# Patient Record
Sex: Male | Born: 1980 | Race: White | Hispanic: No | Marital: Married | Smoking: Never smoker
Health system: Southern US, Community
[De-identification: ages and names within clinical notes are randomized; demographics above are authoritative.]

## PROBLEM LIST (undated history)

## (undated) DIAGNOSIS — I493 Ventricular premature depolarization: Secondary | ICD-10-CM

## (undated) HISTORY — PX: HERNIA REPAIR: SHX51

## (undated) HISTORY — PX: APPENDECTOMY: SHX54

## (undated) HISTORY — PX: OTHER SURGICAL HISTORY: SHX169

## (undated) HISTORY — DX: Ventricular premature depolarization: I49.3

---

## 2009-07-09 DIAGNOSIS — I493 Ventricular premature depolarization: Secondary | ICD-10-CM

## 2009-07-09 HISTORY — DX: Ventricular premature depolarization: I49.3

## 2013-12-04 ENCOUNTER — Encounter (INDEPENDENT_AMBULATORY_CARE_PROVIDER_SITE_OTHER): Payer: Self-pay

## 2013-12-04 ENCOUNTER — Ambulatory Visit (HOSPITAL_BASED_OUTPATIENT_CLINIC_OR_DEPARTMENT_OTHER)
Admission: RE | Admit: 2013-12-04 | Discharge: 2013-12-04 | Disposition: A | Discharge status: Home / Self Care | Payer: BC Managed Care – PPO | Source: Ambulatory Visit | Attending: Family Medicine | Admitting: Family Medicine

## 2013-12-04 ENCOUNTER — Encounter: Payer: Self-pay | Admitting: Family Medicine

## 2013-12-04 ENCOUNTER — Ambulatory Visit (INDEPENDENT_AMBULATORY_CARE_PROVIDER_SITE_OTHER): Payer: BC Managed Care – PPO | Admitting: Family Medicine

## 2013-12-04 VITALS — BP 122/71 | HR 66 | Temp 97.5°F | Ht 72.0 in | Wt 192.8 lb

## 2013-12-04 DIAGNOSIS — M25569 Pain in unspecified knee: Secondary | ICD-10-CM | POA: Insufficient documentation

## 2013-12-04 DIAGNOSIS — M25562 Pain in left knee: Secondary | ICD-10-CM

## 2013-12-04 NOTE — Patient Instructions (Signed)
Get x-rays before you leave today - we will call you with the results (I expect these to be normal). Overall your exam is very reassuring - no evidence meniscus tear, ligament tear, etc based on history, location of pain, exam. 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 Avoid painful activities (especially squats and lunges, plyometrics, increasing running mileage) while this is painful. Straight leg raise, hip side raises, straight leg raises with foot turned outwards 3 sets of 10 once a day for next 6 weeks. Add ankle weight if these become too easy. Correct overpronation with something like Dr. Jari Sportsman active series insoles. Consider shoe evaluation or new shoes if you continue to have problems (like at [employer]). Icing 15 minutes at a time 3-4 times a day if needed. Tylenol or aleve as needed for pain I wouldn't put any restrictions on you necessarily though consider dropping to about 50% of what you would normally do and increase from there by about 10% per week. Follow up with me as needed.

## 2013-12-08 ENCOUNTER — Encounter: Payer: Self-pay | Admitting: Family Medicine

## 2013-12-08 DIAGNOSIS — M25562 Pain in left knee: Secondary | ICD-10-CM | POA: Insufficient documentation

## 2013-12-08 NOTE — Assessment & Plan Note (Signed)
radiographs negative for cyst, other pathology.  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  Exam otherwise benign.  Home exercise program reviewed.  Arch supports/inserts.  Icing, tylenol/nsaids as needed.  Slow progression of activities.  F/u prn.

## 2013-12-08 NOTE — Progress Notes (Signed)
Patient ID: Mike Warren, male   DOB: 1981-02-28, 33 y.o.   MRN: [id number]  PCP: No primary provider on file.  Subjective:   HPI: Patient is a 33 y.o. male here for left knee pain.  Patient denies known injury. He states he was able to run a half marathon about 3 months ago. He did a short run a few days after and the next day had medial knee pain. Seemed worse with sitting then going to get up. No catching, locking, giving out. No swelling, bruising. Has tried HEP (wife is a physical therapist), icing, nsaids. Took a month off from running. Has improved since then but hasn't really tested the knee.  History reviewed. No pertinent past medical history.  No current outpatient prescriptions on file prior to visit.   No current facility-administered medications on file prior to visit.    Past Surgical History  Procedure Laterality Date  . Hernia repair    . Appendectomy      Allergies  Allergen Reactions  . Penicillins Rash    History   Social History  . Marital Status: Married    Spouse Name: N/A    Number of Children: N/A  . Years of Education: N/A   Occupational History  . Not on file.   Social History Main Topics  . Smoking status: Never Smoker   . Smokeless tobacco: Not on file  . Alcohol Use: Not on file  . Drug Use: Not on file  . Sexual Activity: Not on file   Other Topics Concern  . Not on file   Social History Narrative  . No narrative on file    History reviewed. No pertinent family history.  BP 122/71  Pulse 66  Temp(Src) 97.5 F (36.4 C) (Oral)  Ht 6' (1.829 m)  Wt 192 lb 12.8 oz (87.454 kg)  BMI 26.14 kg/m2  SpO2 98%  Review of Systems: See HPI above.    Objective:  Physical Exam:  Gen: NAD  Left knee: No gross deformity, ecchymoses, swelling. No TTP currently (points to above medial joint line, medial kneecap area as location of pain). FROM. Negative ant/post drawers. Negative valgus/varus testing. Negative  lachmanns. Negative mcmurrays, apleys, patellar apprehension, clarkes. Hip abduction 5/5 strength. NV intact distally. Mild overpronation    Assessment & Plan:  1. Left knee pain - radiographs negative for cyst, other pathology.  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  Exam otherwise benign.  Home exercise program reviewed.  Arch supports/inserts.  Icing, tylenol/nsaids as needed.  Slow progression of activities.  F/u prn.

## 2015-04-10 IMAGING — CR DG KNEE AP/LAT W/ SUNRISE*L*
2 series · 2 of 2 positions shown · non-contrast
Comparison: None.

CLINICAL DATA: Medial knee pain after running a distance on
09/20/2013

EXAM:
DG KNEE - 3 VIEWS

[t knee ap left]
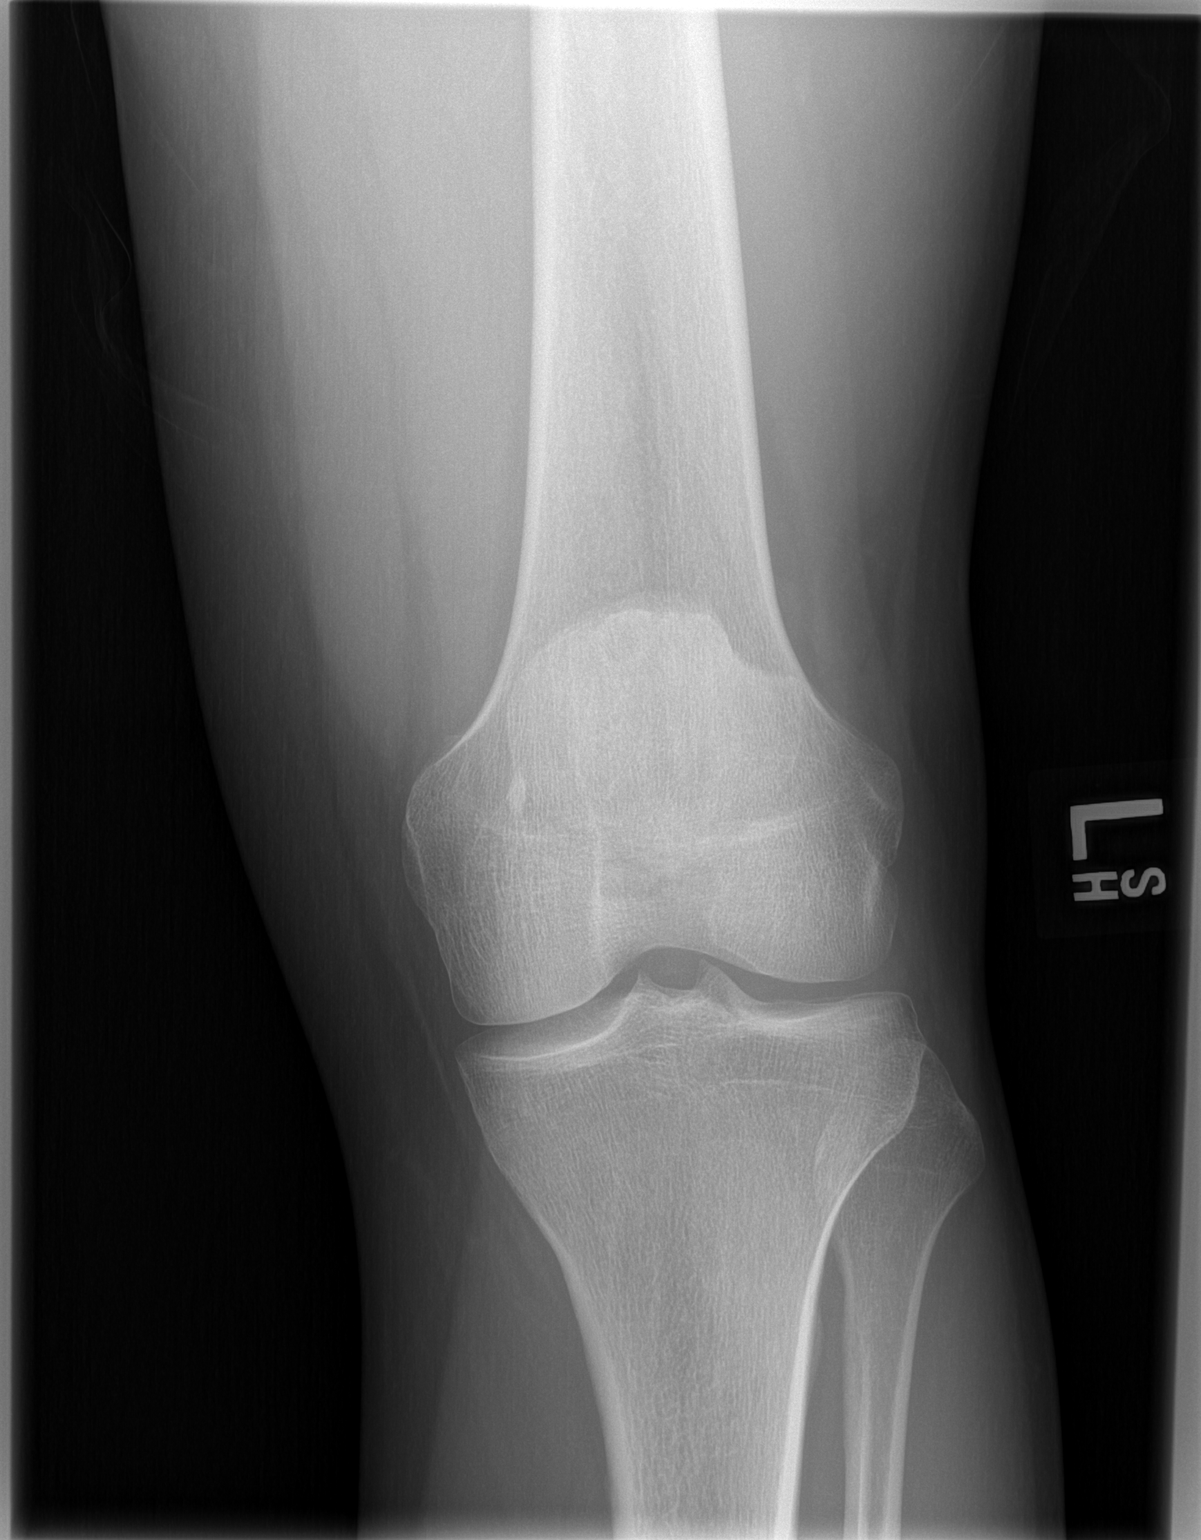

[t knee lat left]
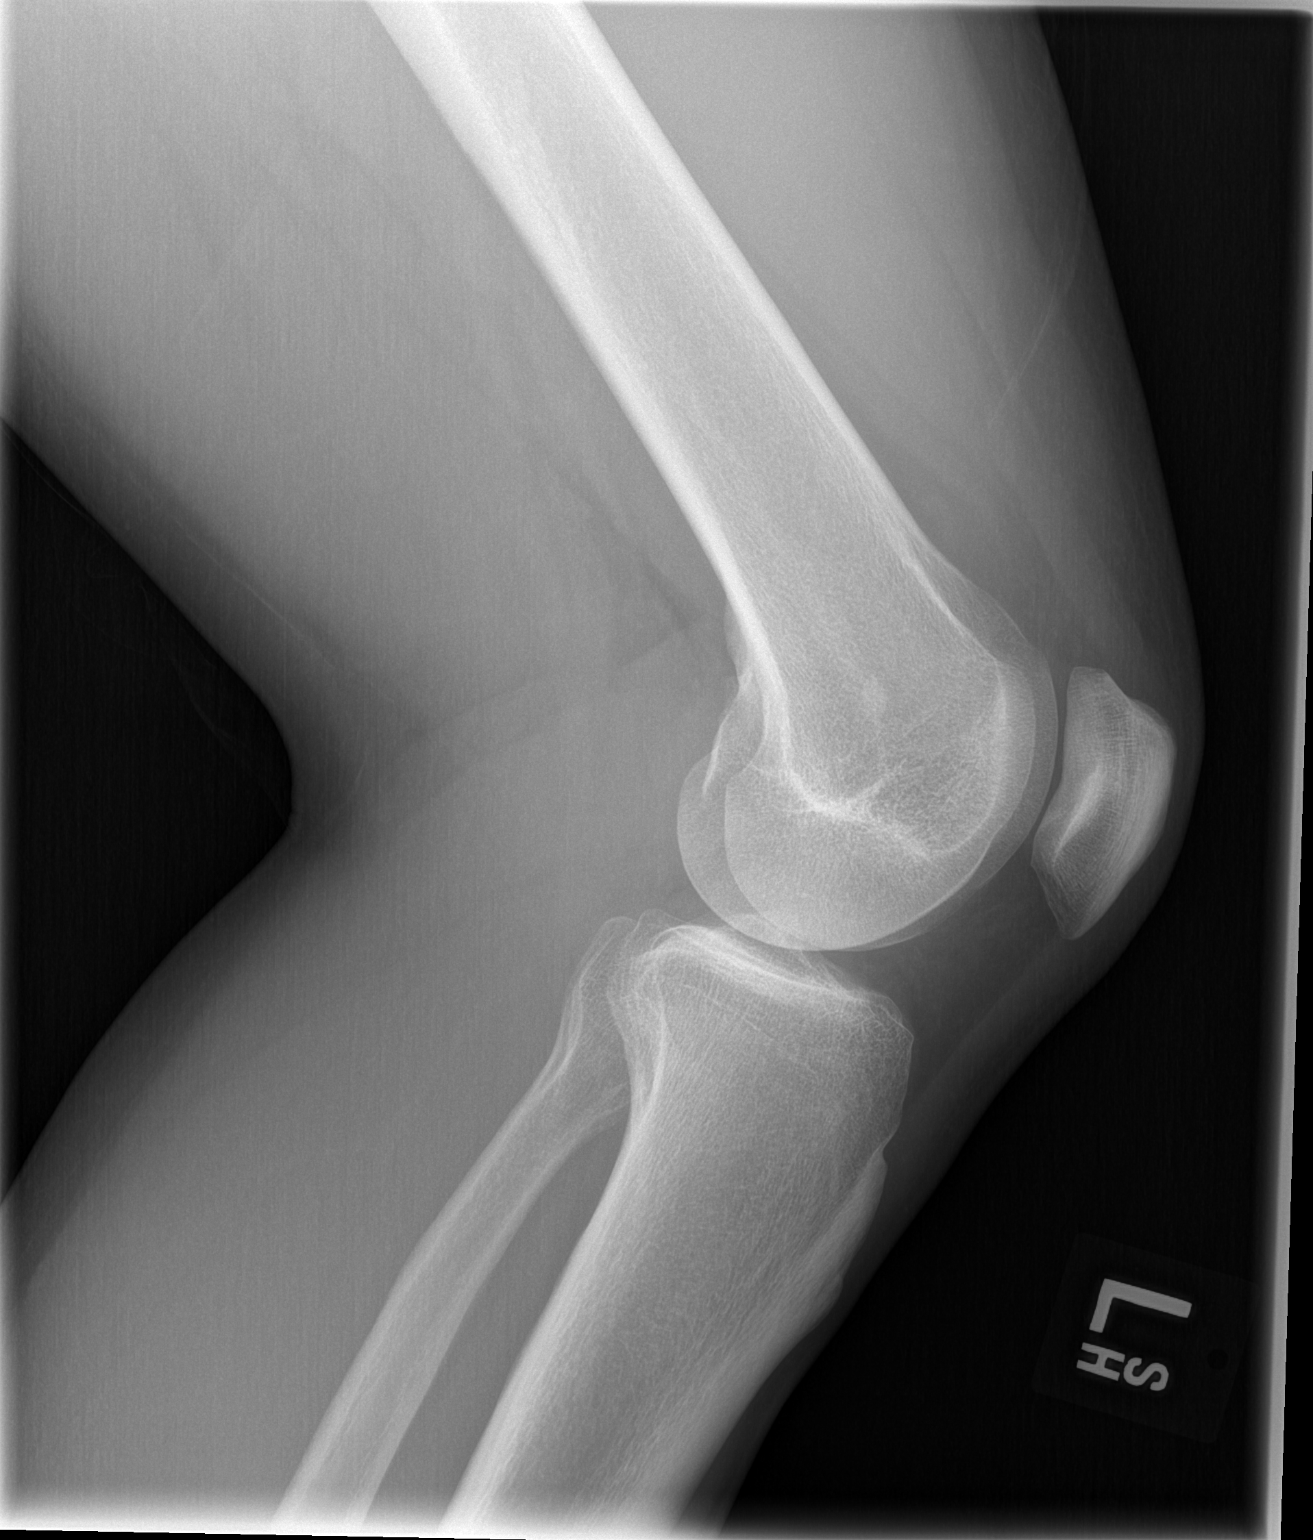

[2 of 2 positions shown; findings below may reference images not displayed]

FINDINGS: There is no evidence of fracture, dislocation, or joint effusion.
There is no evidence of arthropathy or other focal bone abnormality.
Soft tissues are unremarkable.
IMPRESSION: Negative.

## 2015-11-25 DIAGNOSIS — Z0000 Encounter for general adult medical examination without abnormal findings: Secondary | ICD-10-CM | POA: Diagnosis not present

## 2015-12-14 DIAGNOSIS — Z0000 Encounter for general adult medical examination without abnormal findings: Secondary | ICD-10-CM | POA: Diagnosis not present

## 2015-12-14 DIAGNOSIS — Z1389 Encounter for screening for other disorder: Secondary | ICD-10-CM | POA: Diagnosis not present

## 2015-12-14 DIAGNOSIS — Z6826 Body mass index (BMI) 26.0-26.9, adult: Secondary | ICD-10-CM | POA: Diagnosis not present

## 2016-02-24 DIAGNOSIS — R109 Unspecified abdominal pain: Secondary | ICD-10-CM | POA: Diagnosis not present

## 2016-02-24 DIAGNOSIS — Z6826 Body mass index (BMI) 26.0-26.9, adult: Secondary | ICD-10-CM | POA: Diagnosis not present

## 2016-02-24 DIAGNOSIS — R740 Nonspecific elevation of levels of transaminase and lactic acid dehydrogenase [LDH]: Secondary | ICD-10-CM | POA: Diagnosis not present

## 2016-02-24 DIAGNOSIS — R1013 Epigastric pain: Secondary | ICD-10-CM | POA: Diagnosis not present

## 2016-02-29 DIAGNOSIS — R1013 Epigastric pain: Secondary | ICD-10-CM | POA: Diagnosis not present

## 2016-03-06 ENCOUNTER — Encounter: Payer: Self-pay | Admitting: Physician Assistant

## 2016-03-15 ENCOUNTER — Ambulatory Visit (INDEPENDENT_AMBULATORY_CARE_PROVIDER_SITE_OTHER): Payer: BLUE CROSS/BLUE SHIELD | Admitting: Physician Assistant

## 2016-03-15 ENCOUNTER — Encounter (INDEPENDENT_AMBULATORY_CARE_PROVIDER_SITE_OTHER): Payer: Self-pay

## 2016-03-15 ENCOUNTER — Encounter: Payer: Self-pay | Admitting: Physician Assistant

## 2016-03-15 VITALS — BP 110/62 | HR 72 | Ht 72.0 in | Wt 192.0 lb

## 2016-03-15 DIAGNOSIS — R1013 Epigastric pain: Secondary | ICD-10-CM | POA: Diagnosis not present

## 2016-03-15 DIAGNOSIS — K6289 Other specified diseases of anus and rectum: Secondary | ICD-10-CM

## 2016-03-15 DIAGNOSIS — R12 Heartburn: Secondary | ICD-10-CM | POA: Diagnosis not present

## 2016-03-15 DIAGNOSIS — R197 Diarrhea, unspecified: Secondary | ICD-10-CM

## 2016-03-15 MED ORDER — OMEPRAZOLE 40 MG PO CPDR: 40.0000 mg | DELAYED_RELEASE_CAPSULE | Freq: Every day | ORAL | 1 refills | Status: DC

## 2016-03-15 NOTE — Progress Notes (Signed)
Chief Complaint: Epigastric Abdominal Pain, Diarrhea  HPI:  Mike Warren is a 35 year old Caucasian male who presents to clinic today, as a referral from Dr. Link Snuffer, for a complaint of epigastric pain and recent episode of diarrhea. Patient had recent pylori testing on 02/29/16 with a negative IgG, IgA and an IgM positive at 29.8 units. CBC on 02/24/16 was within normal limits and CMP from that day showed a minimally elevated AST at 48 and ALT of 59.  Today, the patient tells me that a little over a month ago he started with "bad heartburn" as well as epigastric pain. He had not had a bowel movement in 3 days and thought this may be related, so he took a dose of MiraLAX, this resulted in fever and urgent liquid diarrhea for almost 2 weeks. The patient notes that his 46-year-old daughter also became sick with the same symptoms on the very next day. He discontinued MiraLax after that one dose, since then he has continued with mostly loose stools, which do have "some more form than earlier this month", and a constant 2-3 out of 10 epigastric aggravating pain. Initially he also describes some anorexia and a total of 15 pound weight loss, his appetite has picked up within the past week or so and he has gained 2-3 pounds back. He did see his primary care provider shortly after this all started with labs as above and was also started on Prilosec 20 mg once daily, he has been on this for around 2-1/2 weeks and has no further symptoms of heartburn, but does continue with his epigastric pain. Associated symptoms include 2 episodes of vomiting, last a week and a half ago.  Patient also describes some rectal pain today. He tells me that this occurs mostly at night while he is laying down and feels like a "shearing/shooting burning pain that radiates from his rectum into his testicle", this can last up to 45 seconds, but then disappears and does not come back. This pain is not elicited by having a bowel movement. Patient  does describe being a runner, though he denies any chronic muscle aches or pains.  Patient denies any further fever, chills, sweats, blood in his stool, melena, continued weight loss, fatigue, nausea or increase in gas or bloat.  Past Medical History:  Diagnosis Date  . PVC (premature ventricular contraction) 2011    Past Surgical History:  Procedure Laterality Date  . APPENDECTOMY    . HERNIA REPAIR      Current Outpatient Prescriptions  Medication Sig Dispense Refill  . omeprazole (PRILOSEC) 40 MG capsule Take 1 capsule (40 mg total) by mouth daily. 30 capsule 1   No current facility-administered medications for this visit.     Allergies as of 03/15/2016 - Review Complete 03/15/2016  Allergen Reaction Noted  . Penicillins Rash 12/04/2013    Family History  Problem Relation Age of Onset  . Parkinson's disease Maternal Grandmother   . Stroke Maternal Grandfather   . Heart attack Paternal Grandmother     Social History   Social History  . Marital status: Married    Spouse name: N/A  . Number of children: N/A  . Years of education: N/A   Occupational History  . Not on file.   Social History Main Topics  . Smoking status: Never Smoker  . Smokeless tobacco: Not on file  . Alcohol use Not on file  . Drug use: Unknown  . Sexual activity: Not on file   Other Topics  Concern  . Not on file   Social History Narrative  . No narrative on file    Review of Systems:    Constitutional: Positive for a weight loss of 15 pounds and fever 1 day No fatigue HEENT: Eyes: No change in vision               Ears, Nose, Throat:  No change in hearing or congestion Skin: No rash or itching Cardiovascular: No chest pain, chest pressure or palpitations     Respiratory: No SOB or cough Gastrointestinal: See HPI and otherwise negative Genitourinary: No dysuria or change in urinary frequency Neurological: No headache, dizziness or syncope Musculoskeletal: No muscle, back pain,  joint pain or stiffness.  Hematologic: No bleeding or bruising Psychiatric: No history of depression or anxiety   Physical Exam:  Vital signs: BP 110/62 (BP Location: Left Arm, Patient Position: Sitting, Cuff Size: Normal)   Pulse 72   Ht 6' (1.829 m)   Wt 192 lb (87.1 kg)   BMI 26.04 kg/m   General:   Pleasant Caucasian male appears to be in NAD, Well developed, Well nourished, alert and cooperative Head:  Normocephalic and atraumatic. Eyes:   PEERL, EOMI. No icterus. Conjunctiva pink. Ears:  Normal auditory acuity. Neck:  Supple Throat: Oral cavity and pharynx without inflammation, swelling or lesion. Teeth in good condition. Lungs: Respirations even and unlabored. Lungs clear to auscultation bilaterally.   No wheezes, crackles, or rhonchi.  Heart: Normal S1, S2. No MRG. Regular rate and rhythm. No peripheral edema, cyanosis or pallor.  Abdomen:  Soft, nondistended, mild TTP in the epigastrium No rebound or guarding. Normal bowel sounds. No appreciable masses or hepatomegaly. Rectal:  Not performed.  Msk:  Symmetrical without gross deformities. Peripheral pulses intact.  Extremities:  Without edema, no deformity or joint abnormality. Normal ROM, normal sensation. Neurologic:  Alert and  oriented x4;  grossly normal neurologically.  Skin:   Dry and intact without significant lesions or rashes. Psychiatric: Oriented to person, place and time. Demonstrates good judgement and reason without abnormal affect or behaviors.  Recent labs: See history of present illness  Assessment: 1. Epigastric pain: Started around a month ago, episode of what sounds like viral illness in between, heartburn which has stopped with Prilosec 20 mg daily for the past 2-1/2 weeks, continue 2-3 out of 10 aggravating epigastric pain, H. pylori testing IgM positive; consider gastritis versus viral process versus GERD versus H. pylori versus other 2. Heartburn: Better after 2-1/2 weeks of Prilosec 20 mg daily 3.  Diarrhea: Per patient he has always had an issue with his bowel movements, typically constipation, and over the past month since "viral episode", now with looser stools which are unpredictable and unrelated to diet, does have some more solid stools; consider postinfectious IBS versus H. pylori versus other sign  4. Rectal pain: Discussed with patient that this sounds like nerve pain, he does not wish to pursue evaluation of this any further at this time as it is very infrequent, explained that if it gets more frequent, we can look at this further; consider Proctalgia fugax  Plan: 1. Recommend patient schedule EGD for further evaluation of possible H. pylori, gastritis or other. Discussed risks, benefits, limitations and alternatives. Will schedule with Dr. Elana Alm exam in the next 1-2 months. If pain completely resolved before then patient may cancel. 2. Provided patient with GERD handout as well as high-fiber handout 3. Recommend starting a probiotic such as align, provided coupon 4. Recommend the  patient stretched well before and after running 5. Increased Omeprazole to 40 mg daily, 30-60 minutes before eating. He should continue this for the next month, if symptoms resolve he may then stop this medication, if not, await further recommendations after time of EGD 6. Patient to follow with Dr. Lavon PaganiniNandigam after time of procedure or sooner if necessary.  Hyacinth MeekerJennifer Lemmon, PA-C [facility] 03/15/2016, 9:59 AM  Cc: Dr. Link SnufferHolwerda

## 2016-03-15 NOTE — Patient Instructions (Signed)
You have been scheduled for an endoscopy. Please follow written instructions given to you at your visit today. If you use inhalers (even only as needed), please bring them with you on the day of your procedure. Your physician has requested that you go to www.startemmi.com and enter the access code given to you at your visit today. This web site gives a general overview about your procedure. However, you should still follow specific instructions given to you by our office regarding your preparation for the procedure.  We have sent the following medications to your pharmacy for you to pick up at your convenience: Omeprazole 40 mg daily.   We have given you a handout for a high fiber diet and reflux regimen.  We have given you some samples of align.

## 2016-03-15 NOTE — Progress Notes (Signed)
Reviewed and agree with documentation and assessment and plan. K. Veena Nandigam , MD   

## 2016-04-18 ENCOUNTER — Encounter: Payer: Self-pay | Admitting: Gastroenterology

## 2016-05-01 ENCOUNTER — Encounter: Payer: Self-pay | Admitting: Gastroenterology

## 2016-05-01 ENCOUNTER — Ambulatory Visit (INDEPENDENT_AMBULATORY_CARE_PROVIDER_SITE_OTHER): Payer: BLUE CROSS/BLUE SHIELD | Admitting: Gastroenterology

## 2016-05-01 VITALS — BP 106/63 | HR 54 | Temp 98.7°F | Resp 12 | Ht 72.0 in | Wt 192.0 lb

## 2016-05-01 DIAGNOSIS — R1013 Epigastric pain: Secondary | ICD-10-CM | POA: Diagnosis present

## 2016-05-01 DIAGNOSIS — K219 Gastro-esophageal reflux disease without esophagitis: Secondary | ICD-10-CM | POA: Diagnosis not present

## 2016-05-01 MED ORDER — SODIUM CHLORIDE 0.9 % IV SOLN: 500.0000 mL | INTRAVENOUS | Status: DC

## 2016-05-01 NOTE — Progress Notes (Signed)
Called to room for pathology. 

## 2016-05-01 NOTE — Op Note (Signed)
[facility] Patient Name: Mike Warren Procedure Date: 05/01/2016 2:38 PM MRN: [id number] Endoscopist: Napoleon FormKavitha V. Nandigam , MD Age: 35 Referring MD:  Date of Birth: 1981/05/04 Gender: Male Account #: [id number] Procedure:                Upper GI endoscopy Indications:              Upper abdominal symptoms that persist despite an                            appropriate trial of therapy, Dyspepsia Medicines:                Monitored Anesthesia Care Procedure:                Pre-Anesthesia Assessment:                           - Prior to the procedure, a History and Physical                            was performed, and patient medications and                            allergies were reviewed. The patient's tolerance of                            previous anesthesia was also reviewed. The risks                            and benefits of the procedure and the sedation                            options and risks were discussed with the patient.                            All questions were answered, and informed consent                            was obtained. Prior Anticoagulants: The patient has                            taken no previous anticoagulant or antiplatelet                            agents. ASA Grade Assessment: II - A patient with                            mild systemic disease. After reviewing the risks                            and benefits, the patient was deemed in                            satisfactory condition to undergo the procedure.  After obtaining informed consent, the endoscope was                            passed under direct vision. Throughout the                            procedure, the patient's blood pressure, pulse, and                            oxygen saturations were monitored continuously. The                            Model GIF-HQ190 [id number]) scope was introduced                            through the  mouth, and advanced to the second part                            of duodenum. The upper GI endoscopy was                            accomplished without difficulty. The patient                            tolerated the procedure well. Scope In: Scope Out: Findings:                 The esophagus was normal.                           Patchy mildly erythematous mucosa without bleeding                            was found in the gastric antrum. Biopsies were                            taken with a cold forceps for Helicobacter pylori                            testing using CLOtest.                           The examined duodenum was normal. Complications:            No immediate complications. Estimated Blood Loss:     Estimated blood loss was minimal. Impression:               - Normal esophagus.                           - Erythematous mucosa in the antrum. Biopsied.                           - Normal examined duodenum. Recommendation:           - Patient has a contact number available for  emergencies. The signs and symptoms of potential                            delayed complications were discussed with the                            patient. Return to normal activities tomorrow.                            Written discharge instructions were provided to the                            patient.                           - Resume previous diet.                           - Continue present medications.                           - Await Clotest results.                           - No repeat upper endoscopy.                           - Return to GI office PRN. Napoleon Form, MD 05/01/2016 3:05:10 PM This report has been signed electronically.

## 2016-05-01 NOTE — Progress Notes (Signed)
No problems noted in the recovery room. maw 

## 2016-05-01 NOTE — Progress Notes (Signed)
Report to PACU, RN, vss, BBS= Clear.  

## 2016-05-01 NOTE — Patient Instructions (Signed)
YOU HAD AN ENDOSCOPIC PROCEDURE TODAY AT THE [facility]:   Refer to the procedure report that was given to you for any specific questions about what was found during the examination.  If the procedure report does not answer your questions, please call your gastroenterologist to clarify.  If you requested that your care partner not be given the details of your procedure findings, then the procedure report has been included in a sealed envelope for you to review at your convenience later.  YOU SHOULD EXPECT: Some feelings of bloating in the abdomen. Passage of more gas than usual.  Walking can help get rid of the air that was put into your GI tract during the procedure and reduce the bloating. If you had a lower endoscopy (such as a colonoscopy or flexible sigmoidoscopy) you may notice spotting of blood in your stool or on the toilet paper. If you underwent a bowel prep for your procedure, you may not have a normal bowel movement for a few days.  Please Note:  You might notice some irritation and congestion in your nose or some drainage.  This is from the oxygen used during your procedure.  There is no need for concern and it should clear up in a day or so.  SYMPTOMS TO REPORT IMMEDIATELY:   Following lower endoscopy (colonoscopy or flexible sigmoidoscopy):  Excessive amounts of blood in the stool  Significant tenderness or worsening of abdominal pains  Swelling of the abdomen that is new, acute  Fever of 100F or higher   Following upper endoscopy (EGD)  Vomiting of blood or coffee ground material  New chest pain or pain under the shoulder blades  Painful or persistently difficult swallowing  New shortness of breath  Fever of 100F or higher  Black, tarry-looking stools  For urgent or emergent issues, a gastroenterologist can be reached at any hour by calling [phone].   DIET:  We do recommend a small meal at first, but then you may proceed to your regular diet.  Drink  plenty of fluids but you should avoid alcoholic beverages for 24 hours.  ACTIVITY:  You should plan to take it easy for the rest of today and you should NOT DRIVE or use heavy machinery until tomorrow (because of the sedation medicines used during the test).    FOLLOW UP: Our staff will call the number listed on your records the next business day following your procedure to check on you and address any questions or concerns that you may have regarding the information given to you following your procedure. If we do not reach you, we will leave a message.  However, if you are feeling well and you are not experiencing any problems, there is no need to return our call.  We will assume that you have returned to your regular daily activities without incident.  If any biopsies were taken you will be contacted by phone or by letter within the next 1-3 weeks.  Please call us at [phone] if you have not heard about the biopsies in 3 weeks.    SIGNATURES/CONFIDENTIALITY: You and/or your care partner have signed paperwork which will be entered into your electronic medical record.  These signatures attest to the fact that that the information above on your After Visit Summary has been reviewed and is understood.  Full responsibility of the confidentiality of this discharge information lies with you and/or your care-partner.    You may resume your current medications today. Await biopsy   results. Please call if any questions or concerns.     

## 2016-05-02 ENCOUNTER — Telehealth: Payer: Self-pay | Admitting: *Deleted

## 2016-05-02 ENCOUNTER — Other Ambulatory Visit: Payer: Self-pay

## 2016-05-02 DIAGNOSIS — R1013 Epigastric pain: Secondary | ICD-10-CM

## 2016-05-02 LAB — HELICOBACTER PYLORI SCREEN-BIOPSY: UREASE: NEGATIVE

## 2016-05-02 NOTE — Telephone Encounter (Signed)
  Follow up Call-  Call back number 05/01/2016  Post procedure Call Back phone  # [phone]  Permission to leave phone message Yes     Patient questions:  Do you have a fever, pain , or abdominal swelling? No. Pain Score  0 *  Have you tolerated food without any problems? Yes.    Have you been able to return to your normal activities? Yes.    Do you have any questions about your discharge instructions: Diet   No. Medications  No. Follow up visit  No.  Do you have questions or concerns about your Care? No.  Actions: * If pain score is 4 or above: No action needed, pain <4.

## 2016-05-02 NOTE — Addendum Note (Signed)
Addended by: Inocente SallesWESTBROOK, Alejah Aristizabal B on: 05/02/2016 03:48 PM   Modules accepted: Orders

## 2016-05-15 DIAGNOSIS — Z3009 Encounter for other general counseling and advice on contraception: Secondary | ICD-10-CM | POA: Diagnosis not present

## 2016-05-27 DIAGNOSIS — J039 Acute tonsillitis, unspecified: Secondary | ICD-10-CM | POA: Diagnosis not present

## 2016-05-27 DIAGNOSIS — J029 Acute pharyngitis, unspecified: Secondary | ICD-10-CM | POA: Diagnosis not present

## 2016-05-27 DIAGNOSIS — R05 Cough: Secondary | ICD-10-CM | POA: Diagnosis not present

## 2016-05-27 DIAGNOSIS — J040 Acute laryngitis: Secondary | ICD-10-CM | POA: Diagnosis not present

## 2016-06-07 DIAGNOSIS — Z302 Encounter for sterilization: Secondary | ICD-10-CM | POA: Diagnosis not present

## 2016-12-21 DIAGNOSIS — Z0000 Encounter for general adult medical examination without abnormal findings: Secondary | ICD-10-CM | POA: Diagnosis not present

## 2016-12-24 DIAGNOSIS — Z0000 Encounter for general adult medical examination without abnormal findings: Secondary | ICD-10-CM | POA: Diagnosis not present

## 2016-12-24 DIAGNOSIS — Z1389 Encounter for screening for other disorder: Secondary | ICD-10-CM | POA: Diagnosis not present

## 2017-03-15 ENCOUNTER — Emergency Department (HOSPITAL_COMMUNITY)
Admission: EM | Admit: 2017-03-15 | Discharge: 2017-03-15 | Disposition: A | Discharge status: Home / Self Care | Payer: BLUE CROSS/BLUE SHIELD | Attending: Emergency Medicine | Admitting: Emergency Medicine

## 2017-03-15 ENCOUNTER — Emergency Department (HOSPITAL_COMMUNITY): Payer: BLUE CROSS/BLUE SHIELD

## 2017-03-15 ENCOUNTER — Encounter (HOSPITAL_COMMUNITY): Payer: Self-pay | Admitting: *Deleted

## 2017-03-15 DIAGNOSIS — R0602 Shortness of breath: Secondary | ICD-10-CM | POA: Diagnosis not present

## 2017-03-15 DIAGNOSIS — R0789 Other chest pain: Secondary | ICD-10-CM | POA: Diagnosis not present

## 2017-03-15 DIAGNOSIS — R079 Chest pain, unspecified: Secondary | ICD-10-CM | POA: Diagnosis not present

## 2017-03-15 LAB — BASIC METABOLIC PANEL
Anion gap: 7 (ref 5–15)
BUN: 14 mg/dL (ref 6–20)
CO2: 25 mmol/L (ref 22–32)
Calcium: 9.1 mg/dL (ref 8.9–10.3)
Chloride: 107 mmol/L (ref 101–111)
Creatinine, Ser: 0.89 mg/dL (ref 0.61–1.24)
GFR calc Af Amer: >60 mL/min (ref >60)
GLUCOSE: 91 mg/dL (ref 65–99)
POTASSIUM: 4 mmol/L (ref 3.5–5.1)
Sodium: 139 mmol/L (ref 135–145)

## 2017-03-15 LAB — HEPATIC FUNCTION PANEL
ALBUMIN: 4.2 g/dL (ref 3.5–5.0)
ALK PHOS: 62 U/L (ref 38–126)
ALT: 27 U/L (ref 17–63)
AST: 27 U/L (ref 15–41)
BILIRUBIN DIRECT: 0.2 mg/dL (ref 0.1–0.5)
BILIRUBIN TOTAL: 0.9 mg/dL (ref 0.3–1.2)
Indirect Bilirubin: 0.7 mg/dL (ref 0.3–0.9)
Total Protein: 7.1 g/dL (ref 6.5–8.1)

## 2017-03-15 LAB — CBC
HEMATOCRIT: 45.6 % (ref 39.0–52.0)
HEMOGLOBIN: 15.7 g/dL (ref 13.0–17.0)
MCH: 31 pg (ref 26.0–34.0)
MCHC: 34.4 g/dL (ref 30.0–36.0)
MCV: 89.9 fL (ref 78.0–100.0)
Platelets: 205 10*3/uL (ref 150–400)
RBC: 5.07 MIL/uL (ref 4.22–5.81)
RDW: 12.4 % (ref 11.5–15.5)
WBC: 4.8 10*3/uL (ref 4.0–10.5)

## 2017-03-15 LAB — I-STAT TROPONIN, ED: Troponin i, poc: 0 ng/mL (ref 0.00–0.08)

## 2017-03-15 LAB — LIPASE, BLOOD: Lipase: 30 U/L (ref 11–51)

## 2017-03-15 MED ORDER — PANTOPRAZOLE SODIUM 20 MG PO TBEC: 20.0000 mg | DELAYED_RELEASE_TABLET | Freq: Every day | ORAL | 0 refills | Status: DC

## 2017-03-15 NOTE — ED Triage Notes (Signed)
Pt reports onset of mid chest discomfort and heaviness this am with mild sob. Airway intact at triage and no acute distress is noted.

## 2017-03-15 NOTE — Discharge Instructions (Signed)
Follow up with your md in 1-2 weeks for recheck °

## 2017-03-15 NOTE — ED Provider Notes (Signed)
[facility] Provider Note   CSN: [id number] Arrival date & time: 03/15/17  [id number]     History   Chief Complaint Chief Complaint  Patient presents with  . Chest Pain  . Shortness of Breath    HPI Mike Warren is a 36 y.o. male.  Patient complains of some chest tightness yesterday and today he woke up with it. It has improved some now. Patient has no family history of coronary artery disease   The history is provided by the patient. No language interpreter was used.  Chest Pain   This is a new problem. The current episode started more than 1 week ago. The problem occurs constantly. The pain is associated with exertion. The pain is present in the substernal region. The pain is at a severity of 4/10. The pain is moderate. The quality of the pain is described as dull. The pain does not radiate. Associated symptoms include shortness of breath. Pertinent negatives include no abdominal pain, no back pain, no cough and no headaches.  Pertinent negatives for past medical history include no seizures.  Shortness of Breath  Associated symptoms include chest pain. Pertinent negatives include no headaches, no cough, no abdominal pain and no rash.    Past Medical History:  Diagnosis Date  . PVC (premature ventricular contraction) 2011    Patient Active Problem List   Diagnosis Date Noted  . Left knee pain 12/08/2013    Past Surgical History:  Procedure Laterality Date  . APPENDECTOMY    . HERNIA REPAIR    . septolasty         Home Medications    Prior to Admission medications   Medication Sig Start Date End Date Taking? Authorizing Provider  omeprazole (PRILOSEC) 40 MG capsule Take 1 capsule (40 mg total) by mouth daily. Patient not taking: Reported on 03/15/2017 03/15/16   Unk LightningLemmon, Jennifer Lynne, PA  pantoprazole (PROTONIX) 20 MG tablet Take 1 tablet (20 mg total) by mouth daily. 03/15/17   Bethann BerkshireZammit, Lymon Kidney, MD    Family History Family History  Problem Relation Age of Onset    . Parkinson's disease Maternal Grandmother   . Stroke Maternal Grandfather   . Heart attack Paternal Grandmother     Social History Social History  Substance Use Topics  . Smoking status: Never Smoker  . Smokeless tobacco: Never Used  . Alcohol use Yes     Comment: occ.     Allergies   Penicillins   Review of Systems Review of Systems  Constitutional: Negative for appetite change and fatigue.  HENT: Negative for congestion, ear discharge and sinus pressure.   Eyes: Negative for discharge.  Respiratory: Positive for shortness of breath. Negative for cough.   Cardiovascular: Positive for chest pain.  Gastrointestinal: Negative for abdominal pain and diarrhea.  Genitourinary: Negative for frequency and hematuria.  Musculoskeletal: Negative for back pain.  Skin: Negative for rash.  Neurological: Negative for seizures and headaches.  Psychiatric/Behavioral: Negative for hallucinations.     Physical Exam Updated Vital Signs BP (!) 138/120   Pulse (!) 57   Temp 98.4 F (36.9 C) (Oral)   Resp 18   SpO2 99%   Physical Exam  Constitutional: He is oriented to person, place, and time. He appears well-developed.  HENT:  Head: Normocephalic.  Eyes: Conjunctivae and EOM are normal. No scleral icterus.  Neck: Neck supple. No thyromegaly present.  Cardiovascular: Normal rate and regular rhythm.  Exam reveals no gallop and no friction rub.   No murmur  heard. Pulmonary/Chest: No stridor. He has no wheezes. He has no rales. He exhibits no tenderness.  Abdominal: He exhibits no distension. There is no tenderness. There is no rebound.  Musculoskeletal: Normal range of motion. He exhibits no edema.  Lymphadenopathy:    He has no cervical adenopathy.  Neurological: He is oriented to person, place, and time. He exhibits normal muscle tone. Coordination normal.  Skin: No rash noted. No erythema.  Psychiatric: He has a normal mood and affect. His behavior is normal.     ED  Treatments / Results  Labs (all labs ordered are listed, but only abnormal results are displayed) Labs Reviewed  BASIC METABOLIC PANEL  CBC  LIPASE, BLOOD  HEPATIC FUNCTION PANEL  I-STAT TROPONIN, ED    EKG  EKG Interpretation  Date/Time:  Friday March 15 2017 09:21:19 EDT Ventricular Rate:  78 PR Interval:  176 QRS Duration: 92 QT Interval:  382 QTC Calculation: 435 R Axis:   87 Text Interpretation:  Normal sinus rhythm Right atrial enlargement Borderline ECG Confirmed by Bethann Berkshire [phone]) on 03/15/2017 12:06:14 PM       Radiology Dg Chest 2 View  Result Date: 03/15/2017 CLINICAL DATA:  Acute mid chest pain EXAM: CHEST  2 VIEW COMPARISON:  None available FINDINGS: The heart size and mediastinal contours are within normal limits. Both lungs are clear. The visualized skeletal structures are unremarkable. IMPRESSION: No active cardiopulmonary disease. Electronically Signed   By: Judie Petit.  Shick M.D.   On: 03/15/2017 10:01    Procedures Procedures (including critical care time)  Medications Ordered in ED Medications - No data to display   Initial Impression / Assessment and Plan / ED Course  I have reviewed the triage vital signs and the nursing notes.  Pertinent labs & imaging results that were available during my care of the patient were reviewed by me and considered in my medical decision making (see chart for details).     Patient's labs and EKG are unremarkable. Doubt coronary artery disease. Patient will be placed on protonic and will follow-up with his PCP  Final Clinical Impressions(s) / ED Diagnoses   Final diagnoses:  Atypical chest pain    New Prescriptions New Prescriptions   PANTOPRAZOLE (PROTONIX) 20 MG TABLET    Take 1 tablet (20 mg total) by mouth daily.     Bethann Berkshire, MD 03/15/17 [id number]

## 2017-12-17 DIAGNOSIS — Z0000 Encounter for general adult medical examination without abnormal findings: Secondary | ICD-10-CM | POA: Diagnosis not present

## 2017-12-26 DIAGNOSIS — Z1331 Encounter for screening for depression: Secondary | ICD-10-CM | POA: Diagnosis not present

## 2017-12-26 DIAGNOSIS — Z6825 Body mass index (BMI) 25.0-25.9, adult: Secondary | ICD-10-CM | POA: Diagnosis not present

## 2017-12-26 DIAGNOSIS — Z0000 Encounter for general adult medical examination without abnormal findings: Secondary | ICD-10-CM | POA: Diagnosis not present

## 2018-08-13 DIAGNOSIS — H6121 Impacted cerumen, right ear: Secondary | ICD-10-CM | POA: Diagnosis not present

## 2018-08-13 DIAGNOSIS — Z6826 Body mass index (BMI) 26.0-26.9, adult: Secondary | ICD-10-CM | POA: Diagnosis not present

## 2018-08-13 DIAGNOSIS — J45909 Unspecified asthma, uncomplicated: Secondary | ICD-10-CM | POA: Diagnosis not present

## 2018-08-13 DIAGNOSIS — R05 Cough: Secondary | ICD-10-CM | POA: Diagnosis not present

## 2018-08-28 DIAGNOSIS — J029 Acute pharyngitis, unspecified: Secondary | ICD-10-CM | POA: Diagnosis not present

## 2018-08-28 DIAGNOSIS — Z6826 Body mass index (BMI) 26.0-26.9, adult: Secondary | ICD-10-CM | POA: Diagnosis not present

## 2018-09-20 ENCOUNTER — Other Ambulatory Visit: Payer: Self-pay

## 2018-09-20 ENCOUNTER — Ambulatory Visit (HOSPITAL_COMMUNITY)
Admission: EM | Admit: 2018-09-20 | Discharge: 2018-09-20 | Disposition: A | Discharge status: Home / Self Care | Payer: BLUE CROSS/BLUE SHIELD | Attending: Internal Medicine | Admitting: Internal Medicine

## 2018-09-20 ENCOUNTER — Encounter (HOSPITAL_COMMUNITY): Payer: Self-pay | Admitting: Emergency Medicine

## 2018-09-20 DIAGNOSIS — J069 Acute upper respiratory infection, unspecified: Secondary | ICD-10-CM | POA: Diagnosis not present

## 2018-09-20 MED ORDER — BENZONATATE 100 MG PO CAPS: 100.0000 mg | ORAL_CAPSULE | Freq: Three times a day (TID) | ORAL | 0 refills | Status: DC

## 2018-09-20 NOTE — ED Provider Notes (Signed)
[facility]    CSN: [id number] Arrival date & time: 09/20/18  1020     History   Chief Complaint Chief Complaint  Patient presents with  . URI    HPI Mike Warren is a 38 y.o. male with no past medical history comes to the urgent care with complaints of runny nose, cough and a mild sore throat which started 2 days ago.  Onset of symptoms was insidious and is gotten progressively severe.  Cough is not productive of sputum.  He admits to having increased fatigue and generalized body aches with a headache.  He denies any fever or chills.  No nausea, vomiting or diarrhea.  Positive sick contact history of close family members.  HPI  Past Medical History:  Diagnosis Date  . PVC (premature ventricular contraction) 2011    Patient Active Problem List   Diagnosis Date Noted  . Left knee pain 12/08/2013    Past Surgical History:  Procedure Laterality Date  . APPENDECTOMY    . HERNIA REPAIR    . septolasty         Home Medications    Prior to Admission medications   Medication Sig Start Date End Date Taking? Authorizing Provider  Pseudoephedrine-APAP-DM (DAYQUIL PO) Take by mouth.   Yes [provider]  omeprazole (PRILOSEC) 40 MG capsule Take 1 capsule (40 mg total) by mouth daily. Patient not taking: Reported on 03/15/2017 03/15/16   Unk Lightning, PA  pantoprazole (PROTONIX) 20 MG tablet Take 1 tablet (20 mg total) by mouth daily. 03/15/17   Bethann Berkshire, MD    Family History Family History  Problem Relation Age of Onset  . Parkinson's disease Maternal Grandmother   . Stroke Maternal Grandfather   . Heart attack Paternal Grandmother     Social History Social History   Tobacco Use  . Smoking status: Never Smoker  . Smokeless tobacco: Never Used  Substance Use Topics  . Alcohol use: Yes    Comment: occ.  . Drug use: No     Allergies   Penicillins   Review of Systems Review of Systems  Constitutional: Positive for activity  change and fatigue. Negative for appetite change, chills and fever.  HENT: Positive for congestion, rhinorrhea and sore throat. Negative for ear discharge, ear pain, facial swelling, mouth sores and postnasal drip.   Eyes: Negative for discharge and itching.  Respiratory: Negative for cough, choking and chest tightness.   Cardiovascular: Negative for chest pain.  Gastrointestinal: Negative for abdominal distention and abdominal pain.  Genitourinary: Negative for dysuria.  Neurological: Negative for dizziness, syncope and light-headedness.  Psychiatric/Behavioral: Negative for confusion.     Physical Exam Triage Vital Signs ED Triage Vitals [09/20/18 1136]  Enc Vitals Group     BP      Pulse      Resp      Temp      Temp src      SpO2      Weight      Height      Head Circumference      Peak Flow      Pain Score 4     Pain Loc      Pain Edu?      Excl. in GC?    No data found.  Updated Vital Signs There were no vitals taken for this visit.  Visual Acuity Right Eye Distance:   Left Eye Distance:   Bilateral Distance:    Right Eye Near:  Left Eye Near:    Bilateral Near:     Physical Exam Constitutional:      Appearance: Normal appearance. He is ill-appearing.  HENT:     Right Ear: Tympanic membrane normal.     Left Ear: Tympanic membrane normal.     Nose: Rhinorrhea present. No congestion.     Mouth/Throat:     Mouth: Mucous membranes are moist.     Pharynx: Posterior oropharyngeal erythema present.  Eyes:     Conjunctiva/sclera: Conjunctivae normal.  Neck:     Musculoskeletal: Normal range of motion. No neck rigidity.  Cardiovascular:     Rate and Rhythm: Normal rate and regular rhythm.     Pulses: Normal pulses.     Heart sounds: Normal heart sounds.  Pulmonary:     Effort: Pulmonary effort is normal.     Breath sounds: Normal breath sounds.  Abdominal:     General: Bowel sounds are normal.     Palpations: Abdomen is soft.  Musculoskeletal: Normal  range of motion.        General: No swelling or deformity.  Lymphadenopathy:     Cervical: No cervical adenopathy.  Skin:    Capillary Refill: Capillary refill takes less than 2 seconds.  Neurological:     Mental Status: He is alert.      UC Treatments / Results  Labs (all labs ordered are listed, but only abnormal results are displayed) Labs Reviewed - No data to display  EKG None  Radiology No results found.  Procedures Procedures (including critical care time)  Medications Ordered in UC Medications - No data to display  Initial Impression / Assessment and Plan / UC Course  I have reviewed the triage vital signs and the nursing notes.  Pertinent labs & imaging results that were available during my care of the patient were reviewed by me and considered in my medical decision making (see chart for details).     1.  Acute nasopharyngitis: Supportive care with Tylenol/Motrin as needed Encourage oral fluid intake Standard precautions of handwashing and coughing precautions given to patient  Final Clinical Impressions(s) / UC Diagnoses   Final diagnoses:  None   Discharge Instructions   None    ED Prescriptions    None     Controlled Substance Prescriptions [place] Controlled Substance Registry consulted? No   Merrilee Jansky, MD 09/20/18 1233

## 2018-09-20 NOTE — ED Triage Notes (Signed)
Onset of symptoms on Thursday.  Started with coughing.  Congested cough, but unable to expectorate.  Has a runny nose, mild sore throat.  Feels very "foggy" has a headache and general soreness

## 2018-12-22 DIAGNOSIS — Z20828 Contact with and (suspected) exposure to other viral communicable diseases: Secondary | ICD-10-CM | POA: Diagnosis not present

## 2018-12-22 DIAGNOSIS — Z0000 Encounter for general adult medical examination without abnormal findings: Secondary | ICD-10-CM | POA: Diagnosis not present

## 2018-12-29 DIAGNOSIS — Z0000 Encounter for general adult medical examination without abnormal findings: Secondary | ICD-10-CM | POA: Diagnosis not present

## 2018-12-29 DIAGNOSIS — Z1331 Encounter for screening for depression: Secondary | ICD-10-CM | POA: Diagnosis not present

## 2019-02-15 ENCOUNTER — Telehealth: Payer: BLUE CROSS/BLUE SHIELD | Admitting: Family

## 2019-02-15 DIAGNOSIS — R6889 Other general symptoms and signs: Secondary | ICD-10-CM | POA: Diagnosis not present

## 2019-02-15 DIAGNOSIS — Z20822 Contact with and (suspected) exposure to covid-19: Secondary | ICD-10-CM

## 2019-02-15 MED ORDER — BENZONATATE 100 MG PO CAPS: 100.0000 mg | ORAL_CAPSULE | Freq: Three times a day (TID) | ORAL | 0 refills | Status: DC | PRN

## 2019-02-15 NOTE — Progress Notes (Signed)
E-Visit for Corona Virus Screening   Your current symptoms could be consistent with the coronavirus.  Many health care providers can now test patients at their office but not all are.  [facility] has multiple testing sites. For information on our COVID testing locations and hours go to [email or web address]  Please quarantine yourself while awaiting your test results.  We are enrolling you in our MyChart Home Montioring for COVID19 . Daily you will receive a questionnaire within the MyChart website. Our COVID 19 response team willl be monitoriing your responses daily.  You can go to one of the  testing sites listed below, while they are opened (see hours). You do need to self isolate until your results return and if positive 14 days from when your symptoms started and until you are 3 days symptom free.   Testing Locations (Monday - Friday, 8 a.m. - 3:30 p.m.) . [place]: [facility], [address], [place], [place]  . [place]: [facility], [address], [place], [place] (entrance off [address])  . [place]: [address], [place],  (across from [facility])  Approximately 5 minutes was spent documenting and reviewing patient's chart.   COVID-19 is a respiratory illness with symptoms that are similar to the flu. Symptoms are typically mild to moderate, but there have been cases of severe illness and death due to the virus. The following symptoms may appear 2-14 days after exposure: . Fever . Cough . Shortness of breath or difficulty breathing . Chills . Repeated shaking with chills . Muscle pain . Headache . Sore throat . New loss of taste or smell . Fatigue . Congestion or runny nose . Nausea or vomiting . Diarrhea  It is vitally important that if you feel that you have an infection such as this virus or any other virus that you stay home and away from  places where you may spread it to others.  You should self-quarantine for 14 days if you have symptoms that could potentially be coronavirus or have been in close contact a with a person diagnosed with COVID-19 within the last 2 weeks. You should avoid contact with people age 65 and older.   You should wear a mask or cloth face covering over your nose and mouth if you must be around other people or animals, including pets (even at home). Try to stay at least 6 feet away from other people. This will protect the people around you.  You can use medication such as A prescription cough medication called Tessalon Perles 100 mg. You may take 1-2 capsules every 8 hours as needed for cough  You may also take acetaminophen (Tylenol) as needed for fever.   Reduce your risk of any infection by using the same precautions used for avoiding the common cold or flu:  . Wash your hands often with soap and warm water for at least 20 seconds.  If soap and water are not readily available, use an alcohol-based hand sanitizer with at least 60% alcohol.  . If coughing or sneezing, cover your mouth and nose by coughing or sneezing into the elbow areas of your shirt or coat, into a tissue or into your sleeve (not your hands). . Avoid shaking hands with others and consider head nods or verbal greetings only. . Avoid touching your eyes, nose, or mouth with unwashed hands.  . Avoid close contact with people who are sick. . Avoid places or   events with large numbers of people in one location, like concerts or sporting events. . Carefully consider travel plans you have or are making. . If you are planning any travel outside or inside the [place], visit the CDC's Travelers' Health webpage for the latest health notices. . If you have some symptoms but not all symptoms, continue to monitor at home and seek medical attention if your symptoms worsen. . If you are having a medical emergency, call 911.  HOME CARE . Only take medications  as instructed by your medical team. . Drink plenty of fluids and get plenty of rest. . A steam or ultrasonic humidifier can help if you have congestion.   GET HELP RIGHT AWAY IF YOU HAVE EMERGENCY WARNING SIGNS** FOR COVID-19. If you or someone is showing any of these signs seek emergency medical care immediately. Call 911 or proceed to your closest emergency facility if: . You develop worsening high fever. . Trouble breathing . Bluish lips or face . Persistent pain or pressure in the chest . New confusion . Inability to wake or stay awake . You cough up blood. . Your symptoms become more severe  **This list is not all possible symptoms. Contact your medical provider for any symptoms that are sever or concerning to you.   MAKE SURE YOU   Understand these instructions.  Will watch your condition.  Will get help right away if you are not doing well or get worse.  Your e-visit answers were reviewed by a board certified advanced clinical practitioner to complete your personal care plan.  Depending on the condition, your plan could have included both over the counter or prescription medications.  If there is a problem please reply once you have received a response from your provider.  Your safety is important to [place].  If you have drug allergies check your prescription carefully.    You can use MyChart to ask questions about today's visit, request a non-urgent call back, or ask for a work or school excuse for 24 hours related to this e-Visit. If it has been greater than 24 hours you will need to follow up with your provider, or enter a new e-Visit to address those concerns. You will get an e-mail in the next two days asking about your experience.  I hope that your e-visit has been valuable and will speed your recovery. Thank you for using e-visits.

## 2019-02-18 ENCOUNTER — Encounter (INDEPENDENT_AMBULATORY_CARE_PROVIDER_SITE_OTHER): Payer: Self-pay

## 2020-11-26 ENCOUNTER — Telehealth: Payer: BLUE CROSS/BLUE SHIELD | Admitting: Physician Assistant

## 2020-11-26 DIAGNOSIS — J050 Acute obstructive laryngitis [croup]: Secondary | ICD-10-CM

## 2020-11-26 MED ORDER — ALBUTEROL SULFATE HFA 108 (90 BASE) MCG/ACT IN AERS: 1.0000 | INHALATION_SPRAY | Freq: Four times a day (QID) | RESPIRATORY_TRACT | 0 refills | Status: AC | PRN

## 2020-11-26 MED ORDER — PREDNISONE 10 MG (21) PO TBPK: ORAL_TABLET | ORAL | 0 refills | Status: AC

## 2020-11-26 MED ORDER — BENZONATATE 100 MG PO CAPS: 100.0000 mg | ORAL_CAPSULE | Freq: Three times a day (TID) | ORAL | 0 refills | Status: AC | PRN

## 2020-11-26 NOTE — Progress Notes (Signed)
We are sorry that you are not feeling well.  Here is how we plan to help!  Based on your presentation I believe you most likely have A cough due to a virus.  This is called viral bronchitis and is best treated by rest, plenty of fluids and control of the cough.  You may use Ibuprofen or Tylenol as directed to help your symptoms.     In addition you may use A prescription cough medication called Tessalon Perles 100mg . You may take 1-2 capsules every 8 hours as needed for your cough.  Prednisone 10 mg daily for 6 days (see taper instructions below)  Directions for 6 day taper: Day 1: 2 tablets before breakfast, 1 after both lunch & dinner and 2 at bedtime Day 2: 1 tab before breakfast, 1 after both lunch & dinner and 2 at bedtime Day 3: 1 tab at each meal & 1 at bedtime Day 4: 1 tab at breakfast, 1 at lunch, 1 at bedtime Day 5: 1 tab at breakfast & 1 tab at bedtime Day 6: 1 tab at breakfast  I will also send in an Albuterol Inhaler. This is a short-acting bronchodilator. You can take 1-2 puffs every 4 hours as needed for shortness of breath or wheezing.  From your responses in the eVisit questionnaire you describe inflammation in the upper respiratory tract which is causing a significant cough.  This is commonly called Bronchitis and has four common causes:    Allergies  Viral Infections  Acid Reflux  Bacterial Infection Allergies, viruses and acid reflux are treated by controlling symptoms or eliminating the cause. An example might be a cough caused by taking certain blood pressure medications. You stop the cough by changing the medication. Another example might be a cough caused by acid reflux. Controlling the reflux helps control the cough.  USE OF BRONCHODILATOR ("RESCUE") INHALERS: There is a risk from using your bronchodilator too frequently.  The risk is that over-reliance on a medication which only relaxes the muscles surrounding the breathing tubes can reduce the effectiveness of  medications prescribed to reduce swelling and congestion of the tubes themselves.  Although you feel brief relief from the bronchodilator inhaler, your asthma may actually be worsening with the tubes becoming more swollen and filled with mucus.  This can delay other crucial treatments, such as oral steroid medications. If you need to use a bronchodilator inhaler daily, several times per day, you should discuss this with your provider.  There are probably better treatments that could be used to keep your asthma under control.     HOME CARE . Only take medications as instructed by your medical team. . Complete the entire course of an antibiotic. . Drink plenty of fluids and get plenty of rest. . Avoid close contacts especially the very young and the elderly . Cover your mouth if you cough or cough into your sleeve. . Always remember to wash your hands . A steam or ultrasonic humidifier can help congestion.   GET HELP RIGHT AWAY IF: . You develop worsening fever. . You become short of breath . You cough up blood. . Your symptoms persist after you have completed your treatment plan MAKE SURE YOU   Understand these instructions.  Will watch your condition.  Will get help right away if you are not doing well or get worse.  Your e-visit answers were reviewed by a board certified advanced clinical practitioner to complete your personal care plan.  Depending on the condition, your  plan could have included both over the counter or prescription medications. If there is a problem please reply  once you have received a response from your provider. Your safety is important to [place].  If you have drug allergies check your prescription carefully.    You can use MyChart to ask questions about today's visit, request a non-urgent call back, or ask for a work or school excuse for 24 hours related to this e-Visit. If it has been greater than 24 hours you will need to follow up with your provider, or enter a new  e-Visit to address those concerns. You will get an e-mail in the next two days asking about your experience.  I hope that your e-visit has been valuable and will speed your recovery. Thank you for using e-visits.  I provided 6 minutes of non face-to-face time during this encounter for chart review and documentation.

## 2020-12-18 ENCOUNTER — Other Ambulatory Visit: Payer: Self-pay | Admitting: Physician Assistant

## 2020-12-18 DIAGNOSIS — J050 Acute obstructive laryngitis [croup]: Secondary | ICD-10-CM

## 2023-11-16 ENCOUNTER — Other Ambulatory Visit: Payer: Self-pay

## 2023-11-16 ENCOUNTER — Emergency Department (HOSPITAL_BASED_OUTPATIENT_CLINIC_OR_DEPARTMENT_OTHER): Admitting: Radiology

## 2023-11-16 ENCOUNTER — Emergency Department (HOSPITAL_BASED_OUTPATIENT_CLINIC_OR_DEPARTMENT_OTHER)
Admission: EM | Admit: 2023-11-16 | Discharge: 2023-11-16 | Disposition: A | Discharge status: Home / Self Care | Attending: Emergency Medicine | Admitting: Emergency Medicine

## 2023-11-16 ENCOUNTER — Encounter (HOSPITAL_BASED_OUTPATIENT_CLINIC_OR_DEPARTMENT_OTHER): Payer: Self-pay | Admitting: *Deleted

## 2023-11-16 DIAGNOSIS — R0789 Other chest pain: Secondary | ICD-10-CM | POA: Insufficient documentation

## 2023-11-16 DIAGNOSIS — R079 Chest pain, unspecified: Secondary | ICD-10-CM | POA: Diagnosis present

## 2023-11-16 LAB — COMPREHENSIVE METABOLIC PANEL WITH GFR
ALT: 44 U/L (ref 0–44)
AST: 60 U/L — ABNORMAL HIGH (ref 15–41)
Albumin: 4.5 g/dL (ref 3.5–5.0)
Alkaline Phosphatase: 88 U/L (ref 38–126)
Anion gap: 12 (ref 5–15)
BUN: 17 mg/dL (ref 6–20)
CO2: 27 mmol/L (ref 22–32)
Calcium: 9.7 mg/dL (ref 8.9–10.3)
Chloride: 100 mmol/L (ref 98–111)
Creatinine, Ser: 1.01 mg/dL (ref 0.61–1.24)
GFR, Estimated: >60 mL/min (ref >60)
Glucose, Bld: 90 mg/dL (ref 70–99)
Potassium: 3.9 mmol/L (ref 3.5–5.1)
Sodium: 139 mmol/L (ref 135–145)
Total Bilirubin: 0.3 mg/dL (ref 0.0–1.2)
Total Protein: 7 g/dL (ref 6.5–8.1)

## 2023-11-16 LAB — TROPONIN T, HIGH SENSITIVITY: Troponin T High Sensitivity: <15 ng/L (ref <19)

## 2023-11-16 LAB — CBC WITH DIFFERENTIAL/PLATELET
Abs Immature Granulocytes: 0.02 10*3/uL (ref 0.00–0.07)
Basophils Absolute: 0 10*3/uL (ref 0.0–0.1)
Basophils Relative: 0 %
Eosinophils Absolute: 0.3 10*3/uL (ref 0.0–0.5)
Eosinophils Relative: 3 %
HCT: 45.1 % (ref 39.0–52.0)
Hemoglobin: 15.4 g/dL (ref 13.0–17.0)
Immature Granulocytes: 0 %
Lymphocytes Relative: 25 %
Lymphs Abs: 2.3 10*3/uL (ref 0.7–4.0)
MCH: 30.9 pg (ref 26.0–34.0)
MCHC: 34.1 g/dL (ref 30.0–36.0)
MCV: 90.6 fL (ref 80.0–100.0)
Monocytes Absolute: 0.8 10*3/uL (ref 0.1–1.0)
Monocytes Relative: 9 %
Neutro Abs: 5.8 10*3/uL (ref 1.7–7.7)
Neutrophils Relative %: 63 %
Platelets: 261 10*3/uL (ref 150–400)
RBC: 4.98 MIL/uL (ref 4.22–5.81)
RDW: 12.2 % (ref 11.5–15.5)
WBC: 9.2 10*3/uL (ref 4.0–10.5)
nRBC: 0 % (ref 0.0–0.2)

## 2023-11-16 LAB — D-DIMER, QUANTITATIVE: D-Dimer, Quant: 0.38 ug{FEU}/mL (ref 0.00–0.50)

## 2023-11-16 MED ORDER — PANTOPRAZOLE SODIUM 40 MG IV SOLR
40.0000 mg | Freq: Once | INTRAVENOUS | Status: AC
  Administered 2023-11-16: 40 mg via INTRAVENOUS
  Filled 2023-11-16: qty 10

## 2023-11-16 NOTE — ED Triage Notes (Signed)
 Chest pain intermittently since Monday. Patient describes three separate episodes of right sided pain at first and now right and centralized squeezing pain. Diaphoresis and lightheadedness with pain.

## 2023-11-16 NOTE — ED Provider Notes (Signed)
 [place] [facility] AT [facility] Provider Note   CSN: [id number] Arrival date & time: 11/16/23  1901     History  Chief Complaint  Patient presents with   Chest Pain    Mike Warren is a 43 y.o. male with no significant past medical history presents with concern for a discomfort sensation in the right side of his chest that has been intermittent past couple days.  Pain started 6 days ago.  These episodes will last about 10-15 minutes at a time, then subside.  The sensation come on at random, not associated with exertion.  He does note some feelings of sweatiness along with the episodes.  Denies any shortness of breath, radiation of pain to the back, dizziness.  Denies any cough, fever or chills, or recent illnesses.  Denies any lower leg pain or swelling.  He does report being on a plane ride back from [facility] on Monday.  Denies any history of blood clots, recent surgeries or hospitalizations.   Chest Pain      Home Medications Prior to Admission medications   Medication Sig Start Date End Date Taking? Authorizing Provider  albuterol  (VENTOLIN  HFA) 108 (90 Base) MCG/ACT inhaler Inhale 1-2 puffs into the lungs every 6 (six) hours as needed for wheezing or shortness of breath. 11/26/20   Burnette, Leory Rands, PA-C  benzonatate  (TESSALON ) 100 MG capsule Take 1 capsule (100 mg total) by mouth 3 (three) times daily as needed. 11/26/20   Burnette, Jennifer M, PA-C  predniSONE  (STERAPRED UNI-PAK 21 TAB) 10 MG (21) TBPK tablet 6 day taper; take as directed on package instructions 11/26/20   Angelia Kelp, PA-C      Allergies    Penicillins    Review of Systems   Review of Systems  Cardiovascular:  Positive for chest pain.    Physical Exam Updated Vital Signs BP 133/89   Pulse 71   Temp 97.9 F (36.6 C) (Oral)   Resp (!) 22   Ht 6' (1.829 m)   Wt 95.3 kg   SpO2 100%   BMI 28.48 kg/m  Physical Exam Vitals and nursing note reviewed.   Constitutional:      General: He is not in acute distress.    Appearance: He is well-developed.  HENT:     Head: Normocephalic and atraumatic.  Eyes:     Conjunctiva/sclera: Conjunctivae normal.  Cardiovascular:     Rate and Rhythm: Normal rate and regular rhythm.     Heart sounds: No murmur heard.    Comments: Radial pulse 2+ bilaterally Pulmonary:     Effort: Pulmonary effort is normal. No respiratory distress.     Breath sounds: Normal breath sounds.  Abdominal:     Palpations: Abdomen is soft.     Tenderness: There is no abdominal tenderness.  Musculoskeletal:        General: No swelling.     Cervical back: Neck supple.     Right lower leg: No edema.     Left lower leg: No edema.     Comments: No calf tenderness to palpation bilaterally Moves all extremities without difficulty  Skin:    General: Skin is warm and dry.     Capillary Refill: Capillary refill takes less than 2 seconds.  Neurological:     Mental Status: He is alert.  Psychiatric:        Mood and Affect: Mood normal.     ED Results / Procedures / Treatments   Labs (all  labs ordered are listed, but only abnormal results are displayed) Labs Reviewed  COMPREHENSIVE METABOLIC PANEL WITH GFR - Abnormal; Notable for the following components:      Result Value   AST 60 (*)    All other components within normal limits  CBC WITH DIFFERENTIAL/PLATELET  D-DIMER, QUANTITATIVE  TROPONIN T, HIGH SENSITIVITY    EKG EKG Interpretation Date/Time:  Saturday Nov 16 2023 19:54:36 EDT Ventricular Rate:  66 PR Interval:  192 QRS Duration:  106 QT Interval:  410 QTC Calculation: 430 R Axis:   71  Text Interpretation: Sinus rhythm ST elev, probable normal early repol pattern No significant change since last tracing Confirmed by Florette Hurry [phone]) on 11/16/2023 9:41:03 PM  Radiology DG Chest 2 View Result Date: 11/16/2023 CLINICAL DATA:  Chest pain. EXAM: CHEST - 2 VIEW COMPARISON:  03/15/2017 FINDINGS: The  cardiomediastinal contours are normal. The lungs are clear. Pulmonary vasculature is normal. No consolidation, pleural effusion, or pneumothorax. No acute osseous abnormalities are seen. IMPRESSION: No active cardiopulmonary disease. Electronically Signed   By: Chadwick Colonel M.D.   On: 11/16/2023 21:37    Procedures Procedures    Medications Ordered in ED Medications  pantoprazole  (PROTONIX ) injection 40 mg (40 mg Intravenous Given 11/16/23 2040)    ED Course/ Medical Decision Making/ A&P             HEART Score: 0                    Medical Decision Making    Differential diagnosis includes but is not limited to ACS, arrhythmia, aortic aneurysm, pericarditis, myocarditis, pericardial effusion, cardiac tamponade, musculoskeletal pain, GERD, Boerhaave's syndrome, DVT/PE, pneumonia, pleural effusion   ED Course:  Upon initial evaluation, patient well-appearing, stable vital signs.  No acute distress.  Reporting intermittent chest discomfort for the last week.  No current pain.   Labs Ordered: I Ordered, and personally interpreted labs.  The pertinent results include:   Troponin under 15 D-dimer within normal limits CBC and CMP unremarkable  Imaging Studies ordered: I ordered imaging studies including chest x-ray I independently visualized the imaging with scope of interpretation limited to determining acute life threatening conditions related to emergency care. Imaging showed no acute abnormalities I agree with the radiologist interpretation   Cardiac Monitoring: / EKG: The patient was maintained on a cardiac monitor.  I personally viewed and interpreted the cardiac monitored which showed an underlying rhythm of: Normal sinus rhythm, no ST changes    Upon re-evaluation, patient still well-appearing, stable vitals.   Low concern for ACS at this time given troponin remains stable with initial troponin of less than 15, pain has been on and off for a week, pain  non-exertional, and EKG with normal sinus rhythm and no ST changes. HEART score of 0. Chest x-ray without any acute abnormality. No concern for DVT or PE at this time given d-dimer within normal limits.  Patient's labs are reassuring, no leukocytosis, no fever or chills, low concern for infectious etiology like pneumonia.  Patient stable and appropriate for discharge home this time    Impression: Atypical intermittent chest discomfort  Disposition:  The patient was discharged home with instructions to follow-up with PCP within the next week for further evaluation and management. Return precautions given.   This chart was dictated using voice recognition software, Dragon. Despite the best efforts of this provider to proofread and correct errors, errors may still occur which can change documentation meaning.  Final Clinical Impression(s) / ED Diagnoses Final diagnoses:  Atypical chest pain    Rx / DC Orders ED Discharge Orders     None         Rexie Catena, Kirby Peoples 11/16/23 2149    Dalene Duck, MD 11/16/23 [phone]

## 2023-11-16 NOTE — Discharge Instructions (Addendum)
 Your workup today is reassuring. It is very unlikely that your pain is due to an issue in your heart.  Your cardiac enzyme (troponin) was normal today. Your EKG which is a measure of the heart's electrical activity and rhythm is normal today. These would both show abnormalities if you were having a heart attack.  Your lab (d-dimer) to check for a blood clot was normal.   Your chest x-ray is normal today.  Your blood counts, electrolytes, kidney, and liver function were normal.  Please follow-up with your PCP within the next week if your symptoms are not starting to improve on their own.  Return to the ER if you have any shortness of breath, difficulty breathing, worsening chest pain, dizziness, jaw pain, left arm or shoulder pain, abdominal pain, unexplained fever, any other new or concerning symptoms.
